# Patient Record
Sex: Female | Born: 1970 | Race: White | Hispanic: No | Marital: Married | Smoking: Never smoker
Health system: Southern US, Academic
[De-identification: ages and names within clinical notes are randomized; demographics above are authoritative.]

## PROBLEM LIST (undated history)

## (undated) DIAGNOSIS — F32A Depression, unspecified: Secondary | ICD-10-CM

## (undated) DIAGNOSIS — I10 Essential (primary) hypertension: Secondary | ICD-10-CM

## (undated) DIAGNOSIS — E079 Disorder of thyroid, unspecified: Secondary | ICD-10-CM

## (undated) HISTORY — PX: HX BREAST AUGMENTATION: SHX7

## (undated) HISTORY — DX: Depression, unspecified: F32.A

## (undated) HISTORY — DX: Disorder of thyroid, unspecified: E07.9

## (undated) HISTORY — DX: Essential (primary) hypertension: I10

## (undated) HISTORY — PX: FACIAL COSMETIC SURGERY: SHX629

---

## 2007-06-25 ENCOUNTER — Other Ambulatory Visit: Payer: Self-pay

## 2007-06-29 LAB — HISTORICAL CYTOPATHOLOGY-GYN (PAP AND HPV TESTS)

## 2008-09-07 ENCOUNTER — Other Ambulatory Visit: Payer: Self-pay

## 2008-09-08 LAB — HISTORICAL CYTOPATHOLOGY-GYN (PAP AND HPV TESTS)

## 2011-02-19 ENCOUNTER — Ambulatory Visit (HOSPITAL_COMMUNITY): Payer: Self-pay | Admitting: Obstetrics & Gynecology

## 2016-09-29 ENCOUNTER — Other Ambulatory Visit (HOSPITAL_COMMUNITY): Payer: Self-pay | Admitting: Obstetrics & Gynecology

## 2016-09-29 DIAGNOSIS — Z1231 Encounter for screening mammogram for malignant neoplasm of breast: Secondary | ICD-10-CM

## 2016-12-30 ENCOUNTER — Ambulatory Visit (HOSPITAL_BASED_OUTPATIENT_CLINIC_OR_DEPARTMENT_OTHER): Payer: Self-pay

## 2016-12-30 ENCOUNTER — Ambulatory Visit
Admission: RE | Admit: 2016-12-30 | Discharge: 2016-12-30 | Disposition: A | Discharge status: Home / Self Care | Payer: BC Managed Care – PPO | Source: Ambulatory Visit | Attending: Obstetrics & Gynecology | Admitting: Obstetrics & Gynecology

## 2016-12-30 DIAGNOSIS — Z1231 Encounter for screening mammogram for malignant neoplasm of breast: Secondary | ICD-10-CM

## 2016-12-30 DIAGNOSIS — Z9882 Breast implant status: Secondary | ICD-10-CM | POA: Insufficient documentation

## 2017-10-29 ENCOUNTER — Other Ambulatory Visit (HOSPITAL_COMMUNITY): Payer: Self-pay | Admitting: Obstetrics & Gynecology

## 2017-10-29 DIAGNOSIS — Z1239 Encounter for other screening for malignant neoplasm of breast: Secondary | ICD-10-CM

## 2018-01-07 ENCOUNTER — Ambulatory Visit
Admission: RE | Admit: 2018-01-07 | Discharge: 2018-01-07 | Disposition: A | Discharge status: Home / Self Care | Payer: BC Managed Care – PPO | Source: Ambulatory Visit | Attending: Obstetrics & Gynecology | Admitting: Obstetrics & Gynecology

## 2018-01-07 ENCOUNTER — Encounter (HOSPITAL_BASED_OUTPATIENT_CLINIC_OR_DEPARTMENT_OTHER): Payer: Self-pay

## 2018-01-07 DIAGNOSIS — Z1231 Encounter for screening mammogram for malignant neoplasm of breast: Secondary | ICD-10-CM | POA: Insufficient documentation

## 2018-01-07 DIAGNOSIS — Z1239 Encounter for other screening for malignant neoplasm of breast: Secondary | ICD-10-CM

## 2018-12-15 ENCOUNTER — Other Ambulatory Visit (HOSPITAL_COMMUNITY): Payer: Self-pay | Admitting: Obstetrics & Gynecology

## 2018-12-15 DIAGNOSIS — Z1239 Encounter for other screening for malignant neoplasm of breast: Secondary | ICD-10-CM

## 2019-01-19 ENCOUNTER — Ambulatory Visit
Admission: RE | Admit: 2019-01-19 | Discharge: 2019-01-19 | Disposition: A | Discharge status: Home / Self Care | Payer: BC Managed Care – PPO | Source: Ambulatory Visit | Attending: Obstetrics & Gynecology | Admitting: Obstetrics & Gynecology

## 2019-01-19 ENCOUNTER — Encounter (HOSPITAL_BASED_OUTPATIENT_CLINIC_OR_DEPARTMENT_OTHER): Payer: Self-pay

## 2019-01-19 ENCOUNTER — Other Ambulatory Visit: Payer: Self-pay

## 2019-01-19 DIAGNOSIS — Z1239 Encounter for other screening for malignant neoplasm of breast: Secondary | ICD-10-CM

## 2019-01-19 DIAGNOSIS — Z1231 Encounter for screening mammogram for malignant neoplasm of breast: Secondary | ICD-10-CM | POA: Insufficient documentation

## 2019-01-29 ENCOUNTER — Other Ambulatory Visit: Payer: Self-pay

## 2019-09-29 ENCOUNTER — Other Ambulatory Visit (HOSPITAL_COMMUNITY): Payer: Self-pay | Admitting: Obstetrics & Gynecology

## 2019-09-29 DIAGNOSIS — Z1239 Encounter for other screening for malignant neoplasm of breast: Secondary | ICD-10-CM

## 2020-01-02 ENCOUNTER — Other Ambulatory Visit: Payer: Self-pay

## 2020-01-26 ENCOUNTER — Other Ambulatory Visit: Payer: Self-pay

## 2020-01-26 ENCOUNTER — Encounter (HOSPITAL_BASED_OUTPATIENT_CLINIC_OR_DEPARTMENT_OTHER): Payer: Self-pay

## 2020-01-26 ENCOUNTER — Ambulatory Visit
Admission: RE | Admit: 2020-01-26 | Discharge: 2020-01-26 | Disposition: A | Discharge status: Home / Self Care | Payer: BC Managed Care – PPO | Source: Ambulatory Visit | Attending: Obstetrics & Gynecology | Admitting: Obstetrics & Gynecology

## 2020-01-26 DIAGNOSIS — Z1239 Encounter for other screening for malignant neoplasm of breast: Secondary | ICD-10-CM

## 2020-09-03 ENCOUNTER — Telehealth (HOSPITAL_BASED_OUTPATIENT_CLINIC_OR_DEPARTMENT_OTHER): Payer: Self-pay | Admitting: Obstetrics & Gynecology

## 2020-09-03 DIAGNOSIS — R232 Flushing: Secondary | ICD-10-CM

## 2020-09-03 NOTE — Telephone Encounter (Signed)
Patient called in stating she was advised to call when she got back from [place] and we will put a order in for labs to check her hormones. Was discussed at 01-26-20 AE with [facility]. Please advise patient.

## 2020-09-04 ENCOUNTER — Other Ambulatory Visit: Payer: Self-pay

## 2020-09-04 ENCOUNTER — Ambulatory Visit: Payer: BC Managed Care – PPO | Attending: Obstetrics & Gynecology

## 2020-09-04 ENCOUNTER — Encounter (HOSPITAL_BASED_OUTPATIENT_CLINIC_OR_DEPARTMENT_OTHER): Payer: Self-pay

## 2020-09-04 DIAGNOSIS — R232 Flushing: Secondary | ICD-10-CM | POA: Insufficient documentation

## 2020-09-04 LAB — FSH: FSH: 0.6 m[IU]/mL

## 2020-09-04 LAB — ESTRADIOL: ESTRADIOL: <24 pg/mL

## 2020-09-04 NOTE — Telephone Encounter (Signed)
Patient called in following up with the message she left yesterday about getting a lab order put in. Please advise patient at earliest convenience.

## 2020-09-06 ENCOUNTER — Other Ambulatory Visit (HOSPITAL_BASED_OUTPATIENT_CLINIC_OR_DEPARTMENT_OTHER): Payer: Self-pay | Admitting: Obstetrics & Gynecology

## 2020-09-06 DIAGNOSIS — Z79890 Hormone replacement therapy (postmenopausal): Secondary | ICD-10-CM

## 2020-09-06 NOTE — Telephone Encounter (Signed)
Patient called in wanting to discuss lab results from where she had it done 2 days ago. Please advise.

## 2020-09-11 ENCOUNTER — Encounter (HOSPITAL_BASED_OUTPATIENT_CLINIC_OR_DEPARTMENT_OTHER): Payer: Self-pay

## 2020-09-11 MED ORDER — ESTRADIOL 2 MG TABLET
2.0000 mg | ORAL_TABLET | Freq: Every day | ORAL | 3 refills | Status: AC
Start: 2020-09-11 — End: 2020-12-10

## 2020-10-03 ENCOUNTER — Other Ambulatory Visit (HOSPITAL_BASED_OUTPATIENT_CLINIC_OR_DEPARTMENT_OTHER): Payer: Self-pay | Admitting: Obstetrics & Gynecology

## 2020-10-03 DIAGNOSIS — Z79890 Hormone replacement therapy (postmenopausal): Secondary | ICD-10-CM

## 2020-10-03 NOTE — Telephone Encounter (Signed)
Patient called in with questions regarding the Estrace she is taking. Was prescribed by [facility] on 09-11-20. Please advise patient.

## 2020-10-03 NOTE — Telephone Encounter (Signed)
LEFT A MESSAGE FOR THE PATIENT TO CALL ME BACK AT [phone]

## 2020-10-04 MED ORDER — MEDROXYPROGESTERONE 2.5 MG TABLET
2.5000 mg | ORAL_TABLET | Freq: Every day | ORAL | 4 refills | Status: DC
Start: 2020-10-04 — End: 2021-02-26

## 2020-10-04 NOTE — Telephone Encounter (Signed)
Patient is on line 1 calling you back. Thank you.

## 2020-10-26 ENCOUNTER — Other Ambulatory Visit (HOSPITAL_BASED_OUTPATIENT_CLINIC_OR_DEPARTMENT_OTHER): Payer: Self-pay | Admitting: Obstetrics & Gynecology

## 2020-10-26 DIAGNOSIS — Z1231 Encounter for screening mammogram for malignant neoplasm of breast: Secondary | ICD-10-CM

## 2021-02-26 ENCOUNTER — Ambulatory Visit
Admission: RE | Admit: 2021-02-26 | Discharge: 2021-02-26 | Disposition: A | Discharge status: Home / Self Care | Payer: Self-pay | Source: Ambulatory Visit | Attending: Obstetrics & Gynecology | Admitting: Obstetrics & Gynecology

## 2021-02-26 ENCOUNTER — Ambulatory Visit (INDEPENDENT_AMBULATORY_CARE_PROVIDER_SITE_OTHER): Payer: BC Managed Care – PPO | Admitting: Obstetrics & Gynecology

## 2021-02-26 ENCOUNTER — Encounter (HOSPITAL_BASED_OUTPATIENT_CLINIC_OR_DEPARTMENT_OTHER): Payer: Self-pay

## 2021-02-26 ENCOUNTER — Other Ambulatory Visit: Payer: Self-pay

## 2021-02-26 ENCOUNTER — Other Ambulatory Visit (HOSPITAL_BASED_OUTPATIENT_CLINIC_OR_DEPARTMENT_OTHER): Payer: Self-pay

## 2021-02-26 ENCOUNTER — Encounter (HOSPITAL_BASED_OUTPATIENT_CLINIC_OR_DEPARTMENT_OTHER): Payer: Self-pay | Admitting: Obstetrics & Gynecology

## 2021-02-26 VITALS — BP 122/82 | Wt 158.8 lb

## 2021-02-26 DIAGNOSIS — Z79890 Hormone replacement therapy (postmenopausal): Secondary | ICD-10-CM | POA: Insufficient documentation

## 2021-02-26 DIAGNOSIS — Z01419 Encounter for gynecological examination (general) (routine) without abnormal findings: Secondary | ICD-10-CM | POA: Insufficient documentation

## 2021-02-26 DIAGNOSIS — Z1212 Encounter for screening for malignant neoplasm of rectum: Secondary | ICD-10-CM

## 2021-02-26 DIAGNOSIS — Z1231 Encounter for screening mammogram for malignant neoplasm of breast: Secondary | ICD-10-CM | POA: Insufficient documentation

## 2021-02-26 MED ORDER — ESTRADIOL 2 MG TABLET
2.0000 mg | ORAL_TABLET | Freq: Every day | ORAL | 3 refills | Status: AC
Start: 2021-02-26 — End: 2021-05-27

## 2021-02-26 MED ORDER — METOPROLOL SUCCINATE ER 25 MG TABLET,EXTENDED RELEASE 24 HR
25.0000 mg | ORAL_TABLET | Freq: Every day | ORAL | 3 refills | Status: AC
Start: 2021-02-26 — End: 2021-05-27

## 2021-02-26 MED ORDER — VENLAFAXINE ER 150 MG CAPSULE,EXTENDED RELEASE 24 HR
150.0000 mg | ORAL_CAPSULE | Freq: Every day | ORAL | 3 refills | Status: DC
Start: 2021-02-26 — End: 2022-04-01

## 2021-02-26 MED ORDER — MEDROXYPROGESTERONE 2.5 MG TABLET
2.5000 mg | ORAL_TABLET | Freq: Every day | ORAL | 4 refills | Status: DC
Start: 2021-02-26 — End: 2022-04-08

## 2021-02-26 NOTE — Progress Notes (Signed)
[facility]  Gynecology Consult    Whitney Giles, 50 y.o. female  Date of Admission:  (Not on file)  Date of service: 02/26/2021  Date of Birth:  1970-09-01    PCP: Whitney Simmer, MD     There are no exam notes on file for this visit.     Information Obtained from: Patient  Chief Complaint   Patient presents with   . Annual Exam     BCM:  Bone Density:  Colonoscopy:2021  Last Mammo: 02/26/2021  Last Pap: 01/26/2020        HPI/subjective:  Whitney Giles is a 50 y.o. G87P2002, White female who presents with an annual exam. Pt notes she is having an increase in "brainfog". She states she has been traveling since 01/31/21 and just got home yesterday. Pt states she stopped at a wellness center yesterday and was told to start meditating to help with her memory. Pt states her bowels have been good. She states she is still physically active and does timed exercises with weights. Overall she is feeling well.  She is on estradiol and Provera.  She feels good on that.  She has no hot flashes.  She continues to exercise regularly.  She travels a lot.    Objective:  ROS:   Constitutional: No fever, chills, or fatigue  Cardiovascular: No chest pain or palpitations  Respiratory: No cough or shortness of breath  Abdomen: No abdominal pain, nausea, or vomiting  Genitourinary: No vaginal discharge or vaginal bleeding  Musculoskeletal: No joint pain or swelling  Neurologic: No headaches, dizziness, or paresthesias    PHYSICAL EXAMINATION:      Weight: 72 kg (158 lb 12.8 oz)     BP (Non-Invasive): 122/82  No LMP recorded (lmp unknown). Patient is perimenopausal.     Constitutional: No acute distress, well-developed, well-nourished  Head: Atraumatic, normocephalic  Cardiovascular: Heart with regular rate and rhythm, no murmurs, rubs, or gallops, normal pulses  Respiratory: Breath sounds are clear, no wheezes, rales, or rhonchi  Abdomen: Abdomen is soft, non-tender without masses, no rebound or guarding  Breast:  Normal, symmetrical, no masses palpated.  Implant x2 noted  Genitourinary: NSSC, no adnexal masses, normal rectal tone uterus high in the vaginal vault no adnexal masses.  Pap from was done.  Rectal exam no masses she is heme-negative.  Extremities: No cyanosis or edema  Neurologic: Grossly intact, alert and oriented x3, normal gait  Psychiatric: Normal affect and behavior    Family History:   Family Medical History:     Problem Relation (Age of Onset)    Breast Cancer Mother    No Known Problems Father, Sister, Brother, Maternal Grandmother, Maternal Grandfather, Paternal Grandmother, Paternal Grandfather, Daughter, Son, Maternal Aunt, Maternal Uncle, Paternal Aunt, Paternal Uncle, Other         OB History   Gravida Para Term Preterm AB Living   2 2 2     2    SAB IAB Ectopic Multiple Live Births           2      # Outcome Date GA Lbr Len/2nd Weight Sex Delivery Anes PTL Lv   2 Term 1995    M CS-Unspec   LIV   1 Term 24    M CS-Unspec   LIV     Past Medical History:   Diagnosis Date   . Disorder of thyroid    . HTN (hypertension)      No Known  Allergies  Dating Summary    No pregnancy episode available        Current Outpatient Medications   Medication Sig   . estradioL (ESTRACE) 2 mg Oral Tablet Take 1 Tablet (2 mg total) by mouth Once a day for 90 days   . levothyroxine (SYNTHROID) 75 mcg Oral Tablet    . medroxyPROGESTERone (PROVERA) 2.5 mg Oral Tablet Take 1 Tablet (2.5 mg total) by mouth Once a day   . metoprolol succinate (TOPROL-XL) 25 mg Oral Tablet Sustained Release 24 hr Take 1 Tablet (25 mg total) by mouth Once a day for 90 days Indications: myocardial reinfarction prevention   . venlafaxine (EFFEXOR XR) 150 mg Oral Capsule, Sust. Release 24 hr Take 1 Capsule (150 mg total) by mouth Once a day for 90 days     Impression/Recommendations:  1. Encounter for well woman exam with routine gynecological exam    2. Hormone replacement therapy      Orders Placed This Encounter   . MAMMO BILATERAL SCREENING-ADDL  VIEWS/BREAST US AS REQ BY RAD   . medroxyPROGESTERone (PROVERA) 2.5 mg Oral Tablet   . estradioL (ESTRACE) 2 mg Oral Tablet   . metoprolol succinate (TOPROL-XL) 25 mg Oral Tablet Sustained Release 24 hr   . venlafaxine (EFFEXOR XR) 150 mg Oral Capsule, Sust. Release 24 hr   . CYTOPATHOLOGY, GYN +/- HIGH RISK HPV       I am scribing for, and in the presence of, Dr. Valla Leaver for services provided on 02/26/2021.  Burnis Kingfisher, SCRIBE       I personally performed the services described in this documentation, as scribed  in my presence, and it is both accurate  and complete.    Radene Ou, MD

## 2021-02-26 NOTE — Addendum Note (Signed)
Addended by: Delila Spence A on: 02/26/2021 02:42 PM     Modules accepted: Orders

## 2021-02-26 NOTE — Nursing Note (Signed)
02/26/21 1400   Fecal Immunochemical Test (FIT)   Fit Result Negative   Lot # [id number]   Expiration Date 08/17/22   Internal control valid Yes   Initials KAF

## 2021-03-07 LAB — THIN PREP PAP (QUEST)

## 2021-03-12 ENCOUNTER — Other Ambulatory Visit: Payer: Self-pay

## 2021-03-23 LAB — CYTOPATHOLOGY, GYN +/- HIGH RISK HPV

## 2021-03-24 ENCOUNTER — Other Ambulatory Visit (HOSPITAL_COMMUNITY): Payer: Self-pay | Admitting: Obstetrics & Gynecology

## 2021-03-24 MED ORDER — FLUCONAZOLE 150 MG TABLET
150.0000 mg | ORAL_TABLET | Freq: Once | ORAL | 0 refills | Status: AC
Start: 2021-03-24 — End: 2021-03-24

## 2022-04-01 ENCOUNTER — Other Ambulatory Visit (HOSPITAL_BASED_OUTPATIENT_CLINIC_OR_DEPARTMENT_OTHER): Payer: Self-pay | Admitting: NURSE PRACTITIONER

## 2022-04-01 DIAGNOSIS — F32A Depression, unspecified: Secondary | ICD-10-CM

## 2022-04-01 MED ORDER — VENLAFAXINE ER 150 MG CAPSULE,EXTENDED RELEASE 24 HR
150.0000 mg | ORAL_CAPSULE | Freq: Every day | ORAL | 0 refills | Status: DC
Start: 2022-04-01 — End: 2022-04-08

## 2022-04-01 NOTE — Telephone Encounter (Signed)
Attempted to contact patient without success, left message to return call to the office.   Almus Woodham, LPN

## 2022-04-01 NOTE — Telephone Encounter (Signed)
Patient returned call to the office. She does need her Effexor refilled to make it to her appt on 04/08/22. Order in pending. She says that she only has 4 pills left at this time.   Lawernce Ion, LPN

## 2022-04-01 NOTE — Telephone Encounter (Signed)
Received refill request:  Venlafaxine HCL ER 150 MG  Qty 90  Take one capsule by mouth once daily    Annual is 04/08/22

## 2022-04-07 NOTE — Progress Notes (Unsigned)
[facility]  OB/GYN, MEDICAL OFFICE BUILDING B  [address]  [facility] [place] [address]  [phone]         ANNUAL VISIT    PATIENT: Whitney Giles  CHART NUMBER: [id number]  DATE OF SERVICE: 04/08/2022    Chief Complaint   Patient presents with    Annual Exam     Patient denies any issues at this time.   Birth control: menopausal   Mammogram:04/08/22  Colonoscopy:2021           HPI: Jerzy Crotteau is a 51 y.o. year old who presents to the office today for her annual exam. No LMP recorded (lmp unknown). Patient is postmenopausal.  Denies any vaginal bleeding.  Follows with specialist in [place] for HRT.  Doing well on HRT.    Extremely dense breast tissue on mammogram.  Mother had breast cancer.  Recommended bilateral breast ultrasound.      PAST MEDICAL HISTORY     Past Medical History:   Diagnosis Date    Depression     Disorder of thyroid     HTN (hypertension)        PAST SURGICAL HISTORY     Past Surgical History:   Procedure Laterality Date    FACIAL COSMETIC SURGERY      HX BREAST AUGMENTATION      HX CESAREAN SECTION         OB HISTORY     OB History   Gravida Para Term Preterm AB Living   2 2 2     2    SAB IAB Ectopic Multiple Live Births           2      # Outcome Date GA Lbr Len/2nd Weight Sex Delivery Anes PTL Lv   2 Term 57    M CS-Unspec   LIV   1 Term 19    M CS-Unspec   LIV       SOCIAL HISTORY     Social History     Socioeconomic History    Marital status: Married   Tobacco Use    Smoking status: Never    Smokeless tobacco: Never   Substance and Sexual Activity    Alcohol use: Yes    Drug use: Not Currently     Types: Marijuana    Sexual activity: Yes     Birth control/protection: Other        FAMILY HISTORY     Family Medical History:       Problem Relation (Age of Onset)    Breast Cancer Mother    No Known Problems Father, Sister, Brother, Maternal Grandmother, Maternal Grandfather, Paternal Grandmother, Paternal Grandfather, Daughter, Son, Maternal Aunt,  Maternal Uncle, Paternal Aunt, Paternal Uncle, Other            CURRENT MEDICATIONS      Current Outpatient Medications   Medication Sig    estradioL (ESTRACE) 2 mg Oral Tablet Take 1 Tablet (2 mg total) by mouth Once a day    levothyroxine (SYNTHROID) 75 mcg Oral Tablet     metoprolol succinate (TOPROL-XL) 25 mg Oral Tablet Sustained Release 24 hr Take 1 Tablet (25 mg total) by mouth Once a day for 90 days Indications: myocardial reinfarction prevention    progesterone micronized (PROMETRIUM) 100 mg Oral Capsule Take 3 Capsules (300 mg total) by mouth    venlafaxine (EFFEXOR XR) 150 mg Oral Capsule, Sust. Release 24 hr Take 1 Capsule (150 mg  total) by mouth Once a day       ALLERGIES   Patient has no known allergies.       REVIEW OF SYSTEMS    Review of Systems   Constitutional:  Negative for fatigue and fever.   HENT:  Negative for congestion and sore throat.    Respiratory:  Negative for cough and shortness of breath.    Cardiovascular:  Negative for chest pain, palpitations and leg swelling.   Gastrointestinal:  Negative for abdominal pain, blood in stool, constipation, diarrhea, nausea and vomiting.   Genitourinary:  Negative for difficulty urinating, dyspareunia, dysuria, pelvic pain, vaginal bleeding, vaginal discharge and vaginal pain.   Musculoskeletal:  Negative for back pain and myalgias.   Skin: Negative.  Negative for rash.   Neurological:  Negative for weakness and headaches.   Psychiatric/Behavioral:  Negative for suicidal ideas. The patient is not nervous/anxious.           PHYSICAL EXAMINATION     Vitals:    04/08/22 1419   BP: 126/76   Pulse: 92   SpO2: 99%   Weight: 70.9 kg (156 lb 3.2 oz)   Height: 1.676 m (5\' 6" )   BMI: 25.26     Physical Exam  Constitutional:       Appearance: Normal appearance.   Genitourinary:      Vulva, bladder, rectum and urethral meatus normal.      Right Labia: No rash, tenderness, lesions, skin changes or Bartholin's cyst.     Left Labia: No tenderness, lesions, skin  changes, Bartholin's cyst or rash.     Vaginal cuff intact.     No vaginal erythema or tenderness.      No vaginal prolapse present.     No vaginal atrophy present.       Right Adnexa: not tender and no mass present.     Left Adnexa: not tender and no mass present.     No cervical motion tenderness or friability.      Uterus is not enlarged or tender.      No uterine mass detected.     Bladder is not tender.       Pelvic exam was performed with patient in the lithotomy position.   Breasts:     Right: Breast implant present. No swelling, bleeding, inverted nipple, mass, nipple discharge, skin change or tenderness.      Left: Breast implant present. No swelling, bleeding, inverted nipple, mass, nipple discharge, skin change or tenderness.   HENT:      Head: Normocephalic and atraumatic.   Eyes:      Extraocular Movements: Extraocular movements intact.      Conjunctiva/sclera: Conjunctivae normal.   Neck:      Thyroid: No thyroid mass, thyromegaly or thyroid tenderness.   Cardiovascular:      Rate and Rhythm: Normal rate and regular rhythm.      Pulses: Normal pulses.      Heart sounds: Normal heart sounds, S1 normal and S2 normal.   Pulmonary:      Effort: Pulmonary effort is normal.      Breath sounds: Normal breath sounds.   Abdominal:      General: Abdomen is flat. Bowel sounds are normal.      Palpations: Abdomen is soft.      Tenderness: There is no abdominal tenderness.   Musculoskeletal:         General: Normal range of motion.      Cervical back: Normal  range of motion and neck supple.   Lymphadenopathy:      Upper Body:      Right upper body: No axillary adenopathy.      Left upper body: No axillary adenopathy.   Neurological:      General: No focal deficit present.      Mental Status: She is alert and oriented to person, place, and time. Mental status is at baseline.   Skin:     General: Skin is warm and dry.      Capillary Refill: Capillary refill takes less than 2 seconds.   Psychiatric:         Mood and  Affect: Mood normal.         Behavior: Behavior normal.   Vitals reviewed.            DEPRESSION SCREENING   Nursing Notes:   Thornton Dales, LPN  32/67/12 4580  Signed     04/08/22 1425   Depression Screen   Little interest or pleasure in doing things. 0   Feeling down, depressed, or hopeless 0   PHQ 2 Total 0         ASSESSMENT/PLAN       ICD-10-CM    1. Encounter for well woman exam with routine gynecological exam  Z01.419       2. Screening for cervical cancer  Z12.4 CYTOPATHOLOGY, GYN +/- HIGH RISK HPV      3. Encounter for screening mammogram for malignant neoplasm of breast  Z12.31 MAMMO BILATERAL SCREENING-ADDL VIEWS/BREAST US AS REQ BY RAD      4. Encounter for Hemoccult screening  Z12.11 POCT FECAL IMMUNOCHEMICAL TEST (FIT) -  negative      5. Depression, unspecified depression type  F32.A venlafaxine (EFFEXOR XR) 150 mg Oral Capsule, Sust. Release 24 hr      6. Extremely dense tissue of both breasts on mammography  R92.343 US BREAST LEFT-ADDL VIEWS/BREAST US AS REQ BY RAD     US BREAST RIGHT-ADDL VIEWS/BREAST US AS REQ BY RAD      7. Family history of breast cancer  Z80.3 US BREAST LEFT-ADDL VIEWS/BREAST US AS REQ BY RAD     US BREAST RIGHT-ADDL VIEWS/BREAST US AS REQ BY RAD            EDUCATION     Contact the office immediately if you have ANY vaginal bleeding.      FOLLOW-UP   Return in about 1 year (around 04/09/2023) for Annual or as needed.         Otelia Sergeant, APRN,FNP-BC

## 2022-04-08 ENCOUNTER — Encounter (HOSPITAL_BASED_OUTPATIENT_CLINIC_OR_DEPARTMENT_OTHER): Payer: Self-pay | Admitting: NURSE PRACTITIONER

## 2022-04-08 ENCOUNTER — Inpatient Hospital Stay
Admission: RE | Admit: 2022-04-08 | Discharge: 2022-04-08 | Disposition: A | Discharge status: Home / Self Care | Payer: 59 | Source: Ambulatory Visit | Attending: Obstetrics & Gynecology | Admitting: Obstetrics & Gynecology

## 2022-04-08 ENCOUNTER — Ambulatory Visit (INDEPENDENT_AMBULATORY_CARE_PROVIDER_SITE_OTHER): Payer: 59 | Admitting: NURSE PRACTITIONER

## 2022-04-08 ENCOUNTER — Other Ambulatory Visit (HOSPITAL_BASED_OUTPATIENT_CLINIC_OR_DEPARTMENT_OTHER): Payer: 59

## 2022-04-08 ENCOUNTER — Encounter (HOSPITAL_BASED_OUTPATIENT_CLINIC_OR_DEPARTMENT_OTHER): Payer: Self-pay

## 2022-04-08 ENCOUNTER — Other Ambulatory Visit: Payer: Self-pay

## 2022-04-08 ENCOUNTER — Ambulatory Visit: Payer: 59 | Attending: NURSE PRACTITIONER | Admitting: NURSE PRACTITIONER

## 2022-04-08 VITALS — BP 126/76 | HR 92 | Ht 66.0 in | Wt 156.2 lb

## 2022-04-08 DIAGNOSIS — Z01419 Encounter for gynecological examination (general) (routine) without abnormal findings: Secondary | ICD-10-CM | POA: Insufficient documentation

## 2022-04-08 DIAGNOSIS — Z1211 Encounter for screening for malignant neoplasm of colon: Secondary | ICD-10-CM

## 2022-04-08 DIAGNOSIS — Z79890 Hormone replacement therapy (postmenopausal): Secondary | ICD-10-CM | POA: Insufficient documentation

## 2022-04-08 DIAGNOSIS — Z1231 Encounter for screening mammogram for malignant neoplasm of breast: Secondary | ICD-10-CM

## 2022-04-08 DIAGNOSIS — Z124 Encounter for screening for malignant neoplasm of cervix: Secondary | ICD-10-CM

## 2022-04-08 DIAGNOSIS — F32A Depression, unspecified: Secondary | ICD-10-CM

## 2022-04-08 DIAGNOSIS — Z803 Family history of malignant neoplasm of breast: Secondary | ICD-10-CM

## 2022-04-08 DIAGNOSIS — R92343 Mammographic extreme density, bilateral breasts: Secondary | ICD-10-CM

## 2022-04-08 LAB — POCT FECAL IMMUNOCHEMICAL TEST (FIT): FIT FECAL TEST: NEGATIVE

## 2022-04-08 MED ORDER — VENLAFAXINE ER 150 MG CAPSULE,EXTENDED RELEASE 24 HR
150.0000 mg | ORAL_CAPSULE | Freq: Every day | ORAL | 3 refills | Status: AC
Start: 2022-04-08 — End: ?

## 2022-04-08 NOTE — Nursing Note (Signed)
04/08/22 1425   Depression Screen   Little interest or pleasure in doing things. 0   Feeling down, depressed, or hopeless 0   PHQ 2 Total 0

## 2022-04-23 LAB — THIN PREP PAP (QUEST)

## 2022-04-24 NOTE — Result Encounter Note (Signed)
Your pap is negative and normal. Follow up as scheduled.    Sundae Maners FNP-BC

## 2022-04-30 ENCOUNTER — Other Ambulatory Visit: Payer: Self-pay

## 2022-04-30 ENCOUNTER — Inpatient Hospital Stay (HOSPITAL_BASED_OUTPATIENT_CLINIC_OR_DEPARTMENT_OTHER)
Admission: RE | Admit: 2022-04-30 | Discharge: 2022-04-30 | Disposition: A | Discharge status: Home / Self Care | Payer: 59 | Source: Ambulatory Visit | Attending: Family | Admitting: Family

## 2022-04-30 ENCOUNTER — Inpatient Hospital Stay
Admission: RE | Admit: 2022-04-30 | Discharge: 2022-04-30 | Disposition: A | Discharge status: Home / Self Care | Payer: 59 | Source: Ambulatory Visit | Attending: NURSE PRACTITIONER | Admitting: NURSE PRACTITIONER

## 2022-04-30 DIAGNOSIS — R92343 Mammographic extreme density, bilateral breasts: Secondary | ICD-10-CM

## 2022-04-30 DIAGNOSIS — Z803 Family history of malignant neoplasm of breast: Secondary | ICD-10-CM

## 2022-05-02 ENCOUNTER — Telehealth (HOSPITAL_BASED_OUTPATIENT_CLINIC_OR_DEPARTMENT_OTHER): Payer: Self-pay | Admitting: NURSE PRACTITIONER

## 2022-05-02 NOTE — Telephone Encounter (Signed)
Discussed pap and [place] results with patient. She stated that she was unable to get on to MyChart to see her messages.   Lawernce Ion, LPN

## 2022-05-02 NOTE — Telephone Encounter (Signed)
Patient is calling in regards to her wanting her results. her number is [phone]

## 2022-05-11 LAB — CYTOPATHOLOGY, GYN +/- HIGH RISK HPV

## 2022-08-25 ENCOUNTER — Telehealth (HOSPITAL_BASED_OUTPATIENT_CLINIC_OR_DEPARTMENT_OTHER): Payer: Self-pay | Admitting: Obstetrics & Gynecology

## 2022-08-25 NOTE — Telephone Encounter (Signed)
[facility]   Request refill authorization    Estradiol 2mg  tab  Qty 90   Take one tablet by mouth once daily     Last filled 02/21/22    No future appt  Annual 04/08/22 completed  Annual 02/26/21 completed

## 2022-08-25 NOTE — Telephone Encounter (Signed)
Last filled: 02/26/2021  Last OV note says she was following with [place] for HRT.  Jonn Shingles, RN

## 2023-04-28 ENCOUNTER — Other Ambulatory Visit: Payer: Self-pay

## 2023-04-28 ENCOUNTER — Ambulatory Visit (HOSPITAL_BASED_OUTPATIENT_CLINIC_OR_DEPARTMENT_OTHER): Payer: 59

## 2023-04-28 ENCOUNTER — Ambulatory Visit (HOSPITAL_BASED_OUTPATIENT_CLINIC_OR_DEPARTMENT_OTHER): Payer: 59 | Admitting: OBSTETRICS/GYNECOLOGY

## 2023-04-28 ENCOUNTER — Inpatient Hospital Stay
Admission: RE | Admit: 2023-04-28 | Discharge: 2023-04-28 | Disposition: A | Discharge status: Home / Self Care | Payer: 59 | Source: Ambulatory Visit | Attending: NURSE PRACTITIONER | Admitting: NURSE PRACTITIONER

## 2023-04-28 DIAGNOSIS — Z029 Encounter for administrative examinations, unspecified: Secondary | ICD-10-CM

## 2023-04-28 DIAGNOSIS — Z1231 Encounter for screening mammogram for malignant neoplasm of breast: Secondary | ICD-10-CM | POA: Insufficient documentation

## 2023-04-30 ENCOUNTER — Telehealth (HOSPITAL_BASED_OUTPATIENT_CLINIC_OR_DEPARTMENT_OTHER): Payer: Self-pay | Admitting: NURSE PRACTITIONER

## 2023-04-30 DIAGNOSIS — R92343 Mammographic extreme density, bilateral breasts: Secondary | ICD-10-CM

## 2023-05-04 NOTE — Telephone Encounter (Signed)
Orders entered for bilateral breast ultrasound.    Whitney Broughton Elwyn Lade, APRN,FNP-BC

## 2023-05-05 ENCOUNTER — Other Ambulatory Visit: Payer: Self-pay

## 2023-05-05 ENCOUNTER — Encounter (HOSPITAL_BASED_OUTPATIENT_CLINIC_OR_DEPARTMENT_OTHER): Payer: Self-pay | Admitting: OBSTETRICS/GYNECOLOGY

## 2023-05-05 ENCOUNTER — Ambulatory Visit (HOSPITAL_BASED_OUTPATIENT_CLINIC_OR_DEPARTMENT_OTHER): Payer: 59 | Admitting: OBSTETRICS/GYNECOLOGY

## 2023-05-05 ENCOUNTER — Other Ambulatory Visit: Payer: 59 | Attending: OBSTETRICS/GYNECOLOGY

## 2023-05-05 VITALS — BP 104/72 | Ht 65.5 in | Wt 152.0 lb

## 2023-05-05 DIAGNOSIS — R9230 Dense breasts, unspecified: Secondary | ICD-10-CM

## 2023-05-05 DIAGNOSIS — Z1231 Encounter for screening mammogram for malignant neoplasm of breast: Secondary | ICD-10-CM | POA: Insufficient documentation

## 2023-05-05 DIAGNOSIS — Z01419 Encounter for gynecological examination (general) (routine) without abnormal findings: Secondary | ICD-10-CM

## 2023-05-05 NOTE — Nursing Note (Signed)
05/05/23 1514   Depression Screen   Little interest or pleasure in doing things. 0   Feeling down, depressed, or hopeless 0   PHQ 2 Total 0

## 2023-05-05 NOTE — Progress Notes (Signed)
OB/GYN, MEDICAL OFFICE [facility]ILDING [facility]  [address]  [facility] [place] [address]    Progress Note    Name: Whitney Giles MRN:  [id number]   Date: 05/05/2023 DOB:  1970/07/22 (52 y.o.)             Encounter Date: 05/05/2023    Patient ID:  Whitney Giles  EPP:[id number]    DOB: November 05, 1970  Age: 52 y.o. female    Subjective:     Chief Complaint   Patient presents with    Annual Exam     Last pap: 04/08/22- normal   Last mammogram: 04/28/23- normal dense breast tissue   Last colonoscopy: 2021- normal   Last bone density: none   LMP: post menopausal     Patient here for annual exam. No issues or concerns. She would like to have u/s ordered for dense breast tissue but she lives in [place] in the winter so needs orders sent to [facility].        HPI:  52 yo G2P2002 here today for annual exam. No problems.  She has been seeing a wellness doctor in [place] who prescribes her HRT for menopause.  She wants an [place] for breast density and plans to get that in [place]. Lives in [place] half the year.       Past Medical History  Past Medical History:   Diagnosis Date    Depression     Disorder of thyroid     HTN (hypertension)          Past Surgical History:   Procedure Laterality Date    FACIAL COSMETIC SURGERY      HX BREAST AUGMENTATION      HX CESAREAN SECTION      x2      Social History     Tobacco Use    Smoking status: Never    Smokeless tobacco: Never   Vaping Use    Vaping status: Never Used   Substance Use Topics    Alcohol use: Yes     Comment: social    Drug use: Not Currently     Types: Marijuana     Comment: gummie      Family Medical History:       Problem Relation (Age of Onset)    Breast Cancer Mother    No Known Problems Father, Sister, Brother, Maternal Grandmother, Maternal Grandfather, Paternal Grandmother, Paternal Grandfather, Daughter, Son, Maternal Aunt, Maternal Uncle, Paternal Aunt, Paternal Uncle, Other             OB History   Gravida Para Term Preterm AB Living   2 2 2     2    SAB IAB Ectopic  Multiple Live Births           2      # Outcome Date GA Lbr Len/2nd Weight Sex Type Anes PTL Lv   2 Term 55    M CS-Unspec   LIV   1 Term 66    M CS-Unspec   LIV     Result Date Procedure Results Follow-ups Next Due   04/08/2022 CYTOPATHOLOGY, GYN +/- HIGH RISK HPV HPV Reflex Request: Other (Specify)  HPV Ordered: No     02/26/2021 CYTOPATHOLOGY, GYN +/- HIGH RISK HPV HPV Reflex Request: Other (Specify)  HPV Ordered: No     09/07/2008 CYTOPATHOLOGY, GYN +/- HIGH RISK HPV      06/25/2007 CYTOPATHOLOGY, GYN +/- HIGH RISK HPV  Current Outpatient Medications   Medication Sig    estradioL (ESTRACE) 2 mg Oral Tablet Take 1 Tablet (2 mg total) by mouth Once a day    levothyroxine (SYNTHROID) 75 mcg Oral Tablet     metoprolol succinate (TOPROL-XL) 25 mg Oral Tablet Sustained Release 24 hr Take 1 Tablet (25 mg total) by mouth Once a day for 90 days Indications: myocardial reinfarction prevention    progesterone micronized (PROMETRIUM) 100 mg Oral Capsule Take 2 Capsules (200 mg total) by mouth Every night    venlafaxine (EFFEXOR XR) 150 mg Oral Capsule, Sust. Release 24 hr Take 1 Capsule (150 mg total) by mouth Once a day     No Known Allergies     Objective:   Vitals:   Vitals:    05/05/23 1510   BP: 104/72   Weight: 68.9 kg (152 lb)   Height: 1.664 m (5' 5.5")   BMI: 24.91      Nursing Notes:   Verner Mould, [facility]  05/05/23 1514  Signed     05/05/23 1514   Depression Screen   Little interest or pleasure in doing things. 0   Feeling down, depressed, or hopeless 0   PHQ 2 Total 0       Physical Exam:   Head: NCAT  Neck: supple  Heart: regular rate and rhythm, no murmurs, rubs or gallops  Lungs: CTAB  Breasts: appear symmetric bilaterally without skin dimpling, nipple retraction, edema or erythema. No palpable masses or lymphadenopathy in axilla or supraclavicular, implants in place  Abdomen: soft, non-tender, nondistended, normoactive BS. No palpable masses.   Pelvic exam: normal appearing external genitalia,  vaginal mucosa appears normal, cervix and uterus are surgically absent. no adnexal masses.   Chaperone: Kinnie Feil, MA      Assessment & Plan:     Problem List Items Addressed This Visit    None  Visit Diagnoses       Dense breast tissue on mammogram, unspecified type    -  Primary    Relevant Orders    US BREAST LEFT-ADDL VIEWS/BREAST US AS REQ BY RAD    US BREAST RIGHT-ADDL VIEWS/BREAST US AS REQ BY RAD              No follow-ups on file.    I have reviewed this note in its entirety and agree with clinical staff on their assessments. Patient consented and agreed to the course of treatment. I have counseled patient and answered all questions.  The patient was informed to contact the office within 7 business days if a message/lab results/referral/imaging results have not been conveyed to the patient.    This note may have been partially generated using MModal Fluency Direct system, and there may be some incorrect words, spellings, and punctuation that were not noted in checking the note before saving, though effort was made to avoid such errors.    Dondra Prader, MD

## 2023-05-09 NOTE — Progress Notes (Signed)
OB/GYN, MEDICAL OFFICE BUILDING B  [address]  [facility] [place] [address]    Progress Note    Name: Whitney Giles MRN:  [id number]   Date: 04/28/2023 DOB:  1970-06-13 (52 y.o.)             Encounter Date: 04/28/2023    Patient ID:  Whitney Giles  ONG:[id number]    DOB: 01/14/1971  Age: 52 y.o. female    Subjective:     Chief Complaint   Patient presents with    Annual Exam     Last pap 04/08/2022. Last mammogram 04/08/2022.        HPI:  Patient rescheduled her visit but nurse had precharted prior to her arrival and started visit     Past Medical History  Past Medical History:   Diagnosis Date    Depression     Disorder of thyroid     HTN (hypertension)          Past Surgical History:   Procedure Laterality Date    FACIAL COSMETIC SURGERY      HX BREAST AUGMENTATION      HX CESAREAN SECTION      x2      Social History     Tobacco Use    Smoking status: Never    Smokeless tobacco: Never   Vaping Use    Vaping status: Never Used   Substance Use Topics    Alcohol use: Yes     Comment: social    Drug use: Not Currently     Types: Marijuana     Comment: gummie      Family Medical History:       Problem Relation (Age of Onset)    Breast Cancer Mother    No Known Problems Father, Sister, Brother, Maternal Grandmother, Maternal Grandfather, Paternal Grandmother, Paternal Grandfather, Daughter, Son, Maternal Aunt, Maternal Uncle, Paternal Aunt, Paternal Uncle, Other             OB History   Gravida Para Term Preterm AB Living   2 2 2     2    SAB IAB Ectopic Multiple Live Births           2      # Outcome Date GA Lbr Len/2nd Weight Sex Type Anes PTL Lv   2 Term 1995    M CS-Unspec   LIV   1 Term 57    M CS-Unspec   LIV     Result Date Procedure Results Follow-ups Next Due   05/05/2023 CYTOPATHOLOGY, GYN +/- HIGH RISK HPV      04/08/2022 CYTOPATHOLOGY, GYN +/- HIGH RISK HPV HPV Reflex Request: Other (Specify)  HPV Ordered: No     02/26/2021 CYTOPATHOLOGY, GYN +/- HIGH RISK HPV HPV Reflex Request: Other  (Specify)  HPV Ordered: No     09/07/2008 CYTOPATHOLOGY, GYN +/- HIGH RISK HPV      06/25/2007 CYTOPATHOLOGY, GYN +/- HIGH RISK HPV          Current Outpatient Medications   Medication Sig    estradioL (ESTRACE) 2 mg Oral Tablet Take 1 Tablet (2 mg total) by mouth Once a day    levothyroxine (SYNTHROID) 75 mcg Oral Tablet     metoprolol succinate (TOPROL-XL) 25 mg Oral Tablet Sustained Release 24 hr Take 1 Tablet (25 mg total) by mouth Once a day for 90 days Indications: myocardial reinfarction prevention    progesterone micronized (PROMETRIUM) 100 mg Oral Capsule Take 2 Capsules (200  mg total) by mouth Every night    venlafaxine (EFFEXOR XR) 150 mg Oral Capsule, Sust. Release 24 hr Take 1 Capsule (150 mg total) by mouth Once a day     No Known Allergies     Objective:   Vitals:   There were no vitals filed for this visit.   There are no exam notes on file for this visit.  OBGyn Exam  Assessment & Plan:     Problem List Items Addressed This Visit    None  Visit Diagnoses       Encounter for screening for malignant neoplasm of breast, unspecified screening modality    -  Primary              No follow-ups on file.    I have reviewed this note in its entirety and agree with clinical staff on their assessments. Patient consented and agreed to the course of treatment. I have counseled patient and answered all questions.  The patient was informed to contact the office within 7 business days if a message/lab results/referral/imaging results have not been conveyed to the patient.    This note may have been partially generated using MModal Fluency Direct system, and there may be some incorrect words, spellings, and punctuation that were not noted in checking the note before saving, though effort was made to avoid such errors.    Dondra Prader, MD

## 2023-05-11 ENCOUNTER — Ambulatory Visit (HOSPITAL_BASED_OUTPATIENT_CLINIC_OR_DEPARTMENT_OTHER): Payer: 59

## 2023-05-11 ENCOUNTER — Other Ambulatory Visit (HOSPITAL_BASED_OUTPATIENT_CLINIC_OR_DEPARTMENT_OTHER): Payer: Self-pay | Admitting: OBSTETRICS/GYNECOLOGY

## 2023-05-11 LAB — CYTOPATHOLOGY, GYN +/- HIGH RISK HPV

## 2023-05-18 ENCOUNTER — Other Ambulatory Visit (HOSPITAL_BASED_OUTPATIENT_CLINIC_OR_DEPARTMENT_OTHER): Payer: 59 | Admitting: OBSTETRICS/GYNECOLOGY

## 2023-06-24 ENCOUNTER — Other Ambulatory Visit (HOSPITAL_BASED_OUTPATIENT_CLINIC_OR_DEPARTMENT_OTHER): Payer: Self-pay | Admitting: OBSTETRICS/GYNECOLOGY

## 2023-06-24 ENCOUNTER — Telehealth (HOSPITAL_BASED_OUTPATIENT_CLINIC_OR_DEPARTMENT_OTHER): Payer: Self-pay | Admitting: OBSTETRICS/GYNECOLOGY

## 2023-06-24 DIAGNOSIS — R9230 Dense breasts, unspecified: Secondary | ICD-10-CM

## 2023-06-24 NOTE — Telephone Encounter (Signed)
Radiology in [facility] needs new external orders for breast u/s.Marland Kitchen Pt was here in December for annual. Mammo showed dense breasts and recommended u/s. Patient lives in [place] during winter months. Can you please sign? Thank you!    Verner Mould, CMA

## 2023-08-17 IMAGING — MR MRI ABDOMEN W/WO CONTRAST
15 of 20 series · 29 of 48 positions shown · IV contrast (gadavist)
Comparison: MRI from June 2023

________________________________________________________________________________________________ 
MRI ABDOMEN W/WO CONTRAST, 08/17/2023 [DATE]: 
CLINICAL INDICATION: Hepatomegaly, not elsewhere classified
TECHNIQUE: Multiplanar, multiecho position MR images of the abdomen were 
performed without and with intravenous enhancement. 6.5 mL of Gadavist were 
injected intravenously by hand. 1 mL of Gadavist discarded.

[Series 101: survey-head 1st · axial · 15.0mm · 1.76mm/px · 1 of 15 slices shown]
[im 1/15]
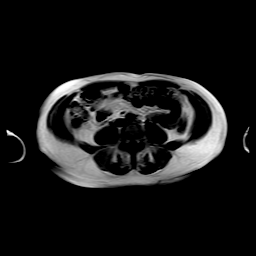

[Series 201: T2 · coronal · 5.0mm · 0.67mm/px · 1 of 34 slices shown]
[im 1/34]
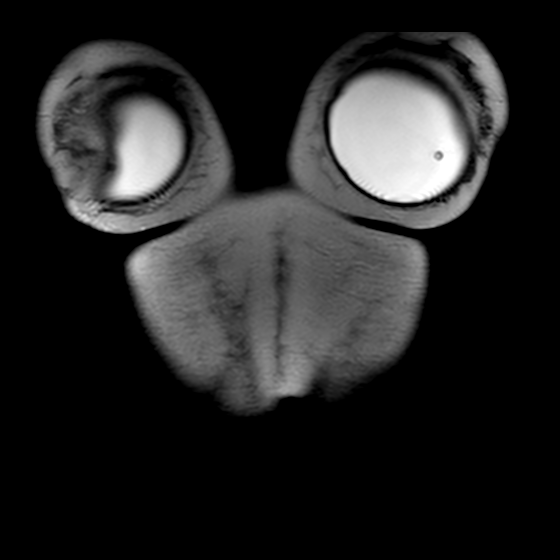

[Series 302: sout of phase · axial · 6.0mm · 1.13mm/px · 1 of 36 slices shown]
[im 1/36]
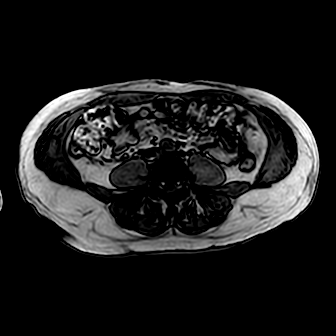

[Series 303: sin phase · axial · 6.0mm · 1.13mm/px · 1 of 36 slices shown]
[im 1/36]
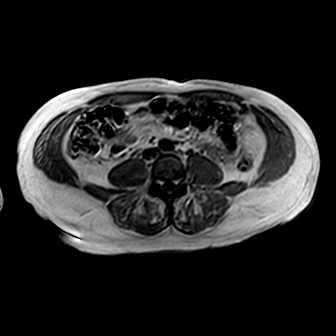

[Series 401: t2_ax_mvxd_hr_rt · axial · 5.0mm · 0.74mm/px · 1 of 41 slices shown]
[im 1/41]
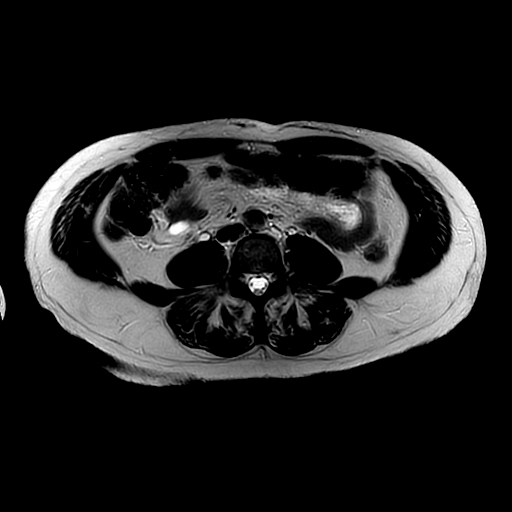

[Series 501: t2_spair mvxd_rt_fast · axial · 5.0mm · 0.85mm/px · 1 of 41 slices shown]
[im 1/41]
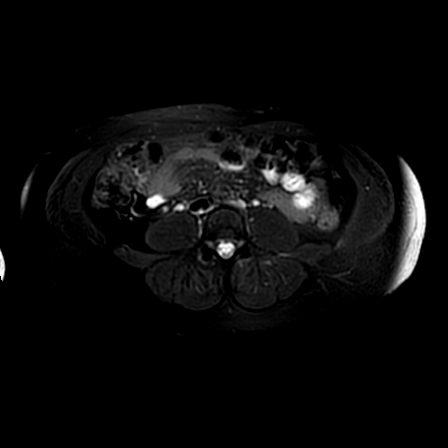

[Series 602: sbo · axial · 5.0mm · 1.70mm/px · 1 of 45 slices shown]
[im 1/45]
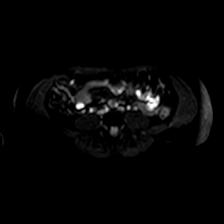

[Series 603: (id) · axial · 5.0mm · 1.70mm/px · 1 of 45 slices shown]
[im 1/45]
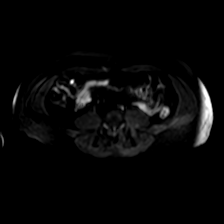

[Series 604: dadc 600 · axial · 5.0mm · 1.70mm/px · 1 of 45 slices shown]
[im 1/45]
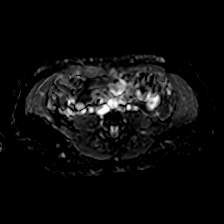

[Series 702: DIXON · axial · 4.0mm · 0.86mm/px · z∈[-47,+197]mm · 3 of 123 slices shown (1 of 5)]
[im 1/123]
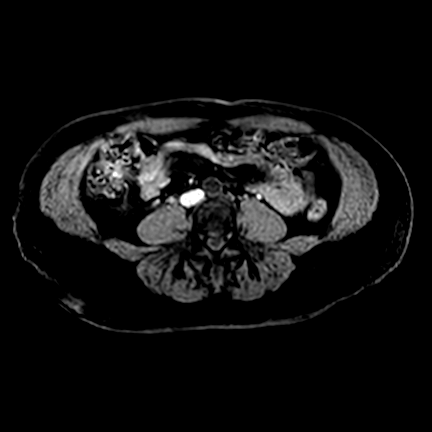
[im 62/123]
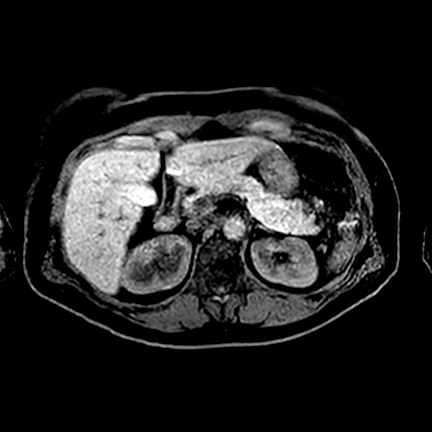
[im 123/123]
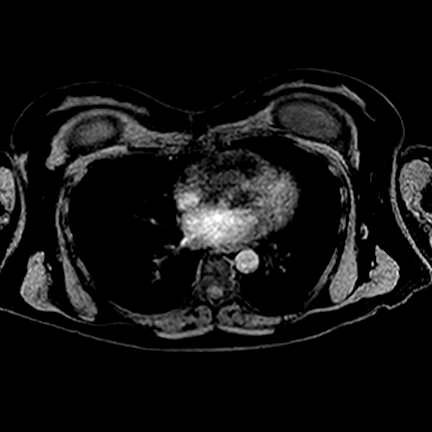

[Series 703: DIXON · axial · 4.0mm · 0.86mm/px · z∈[-47,+197]mm · 3 of 123 slices shown (2 of 5)]
[im 1/123]
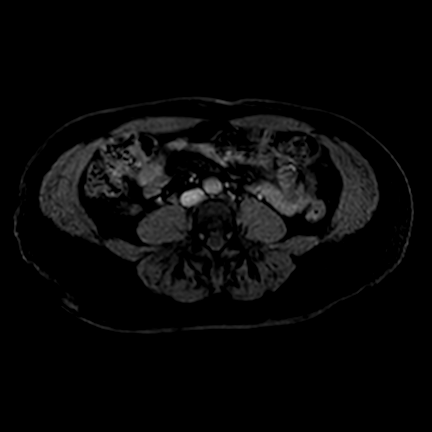
[im 62/123]
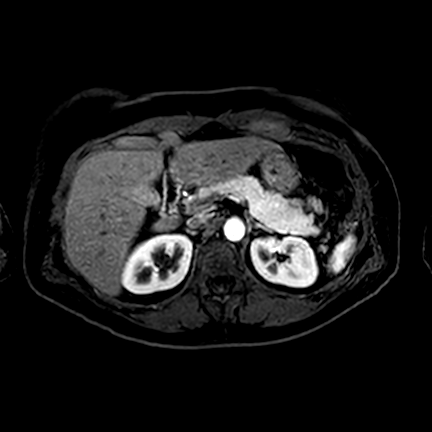
[im 123/123]
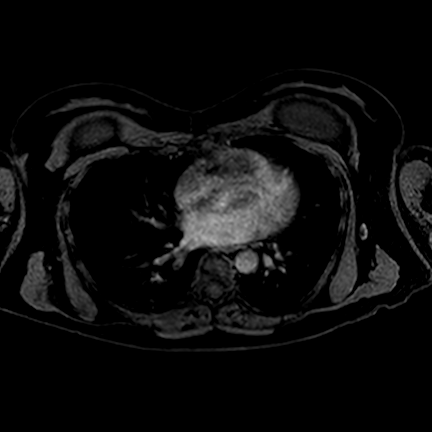

[Series 704: DIXON · axial · 4.0mm · 0.86mm/px · z∈[-47,+197]mm · 4 of 123 slices shown (3 of 5)]
[im 1/123]
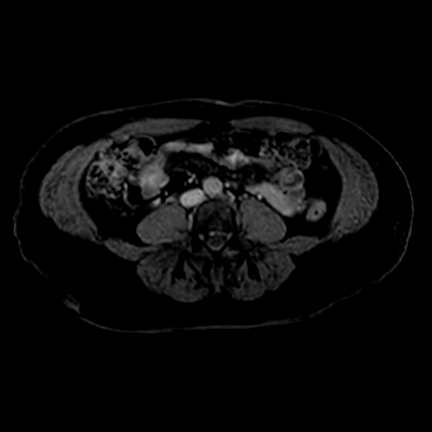
[im 41/123]
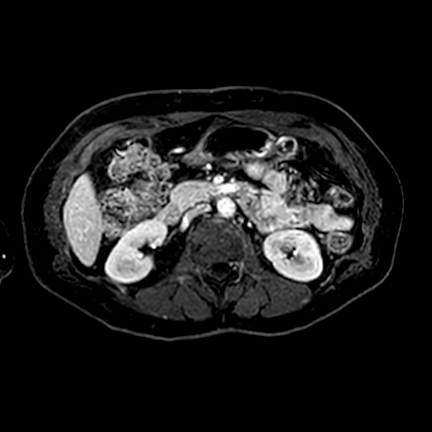
[im 82/123]
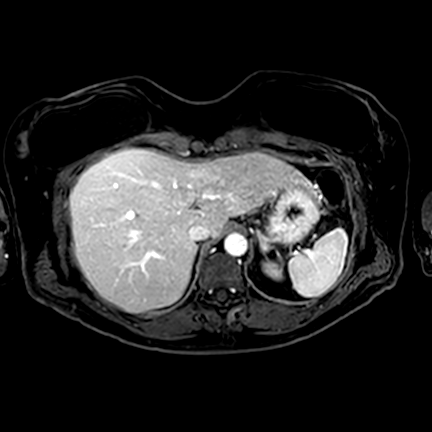
[im 123/123]
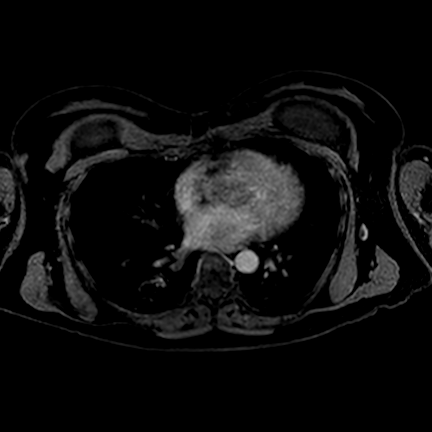

[Series 705: DIXON · axial · 4.0mm · 0.86mm/px · z∈[-47,+197]mm · 4 of 123 slices shown (4 of 5)]
[im 1/123]
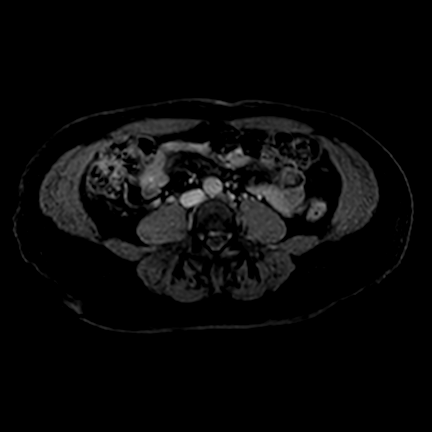
[im 41/123]
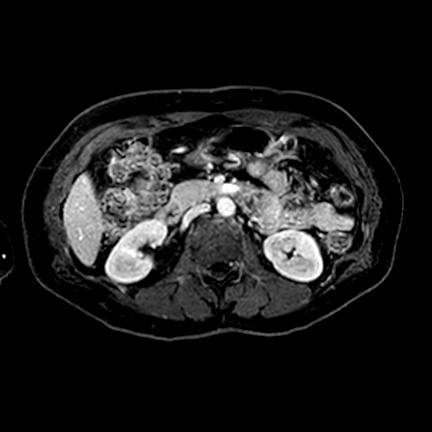
[im 82/123]
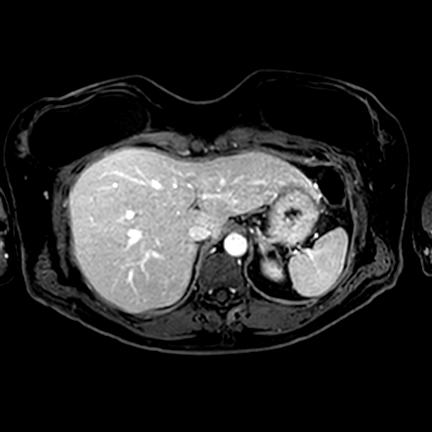
[im 123/123]
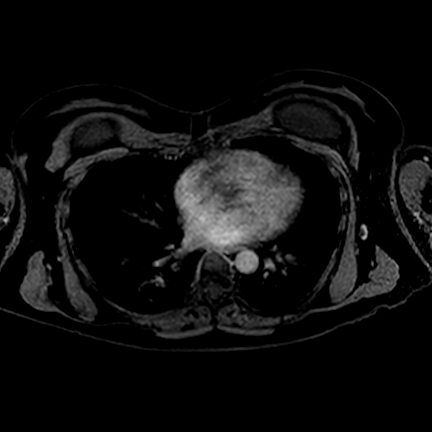

[Series 706: DIXON · axial · 4.0mm · 0.86mm/px · z∈[-47,+197]mm · 4 of 123 slices shown (5 of 5)]
[im 1/123]
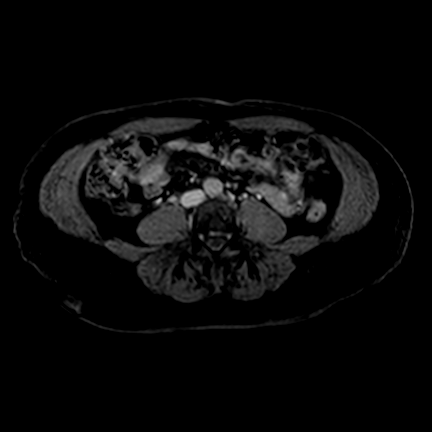
[im 41/123]
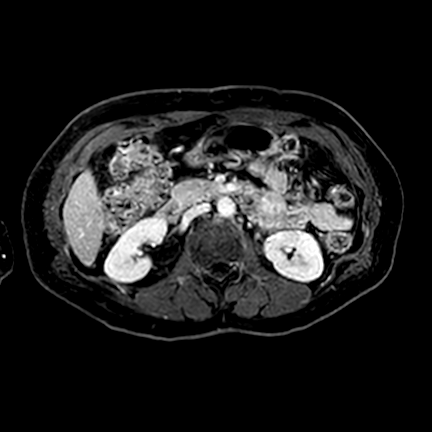
[im 82/123]
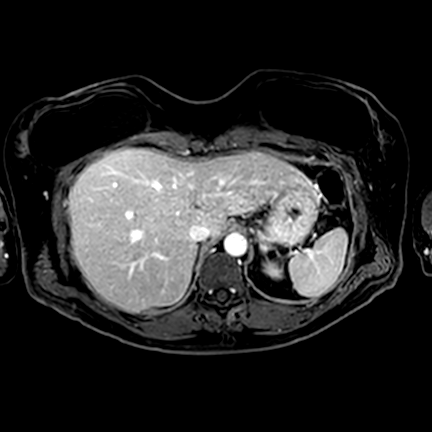
[im 123/123]
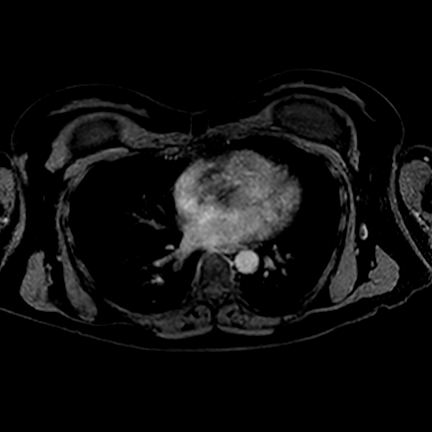

[Series 707: DIXON post-contrast · axial · 4.0mm · 0.86mm/px · z∈[-47,+33]mm · 2 of 123 slices shown]
[im 1/123]
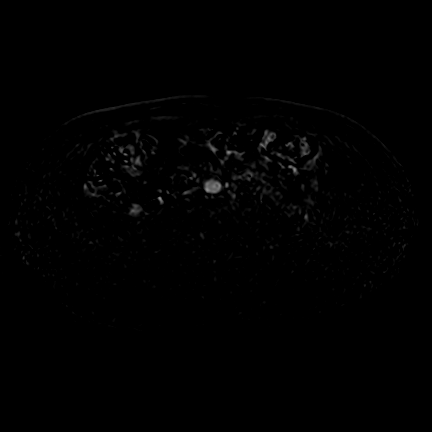
[im 41/123]
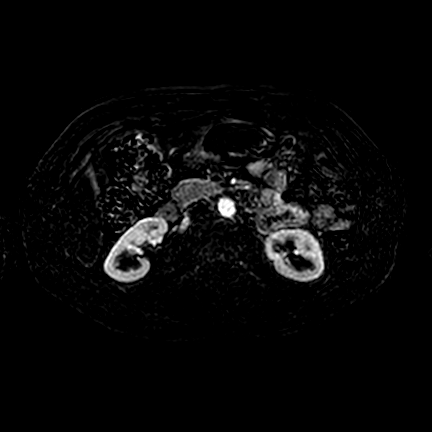

[29 of 48 positions shown; findings below may reference images not displayed]

FINDINGS: The subcapsular lesion described previously as segment 8 and the 
inferior right lobe lesion in segment 6 are again seen and unchanged compared to 
the recent outside MRI. These both show delayed enhancement with filling in on 
delayed images consistent with hemangiomas. No suspicious abnormality seen the 
liver. No new abnormality seen. The gallbladder is unremarkable in appearance. 
The biliary tree is within normal limits. 
The pancreas is unremarkable in appearance without mass or cystic change. 
Pancreatic duct is within normal limits. 
The spleen and adrenal glands are normal in appearance. 
The kidneys are unremarkable in appearance. 
No adenopathy or free fluid. Bilateral saline breast implants are partially 
visualized.
IMPRESSION: 2 hemangiomas in the liver corresponding to the abnormality described on the 
outside MRI. No suspicious abnormality seen.

## 2024-02-17 ENCOUNTER — Telehealth (HOSPITAL_BASED_OUTPATIENT_CLINIC_OR_DEPARTMENT_OTHER): Payer: Self-pay | Admitting: NURSE PRACTITIONER, FAMILY

## 2024-02-17 DIAGNOSIS — Z1231 Encounter for screening mammogram for malignant neoplasm of breast: Secondary | ICD-10-CM

## 2024-02-17 NOTE — Telephone Encounter (Signed)
 Whitney Giles has an upcoming annual in December and she would like to have her screening mammogram done the same day. Could you put an order in the system? Patient is having no issues with her breasts. Please advise. Thanks!

## 2024-02-17 NOTE — Telephone Encounter (Signed)
Order entered for screening mammogram.    Tillie Fantasia, APRN,FNP-BC

## 2024-02-17 NOTE — Telephone Encounter (Signed)
Patient notified of mammogram order entered.  Isaiha Asare, MA

## 2024-02-17 NOTE — Telephone Encounter (Signed)
 Spoke with patient she states she would like to have her mammogram the same day as her annual exam on 05/09/2024. She states no breast lumps or tenderness. Can we put in an order for her?  Katheryn Gal, MA

## 2024-05-05 ENCOUNTER — Encounter (HOSPITAL_BASED_OUTPATIENT_CLINIC_OR_DEPARTMENT_OTHER): Payer: Self-pay | Admitting: NURSE PRACTITIONER, FAMILY

## 2024-05-05 ENCOUNTER — Ambulatory Visit (HOSPITAL_BASED_OUTPATIENT_CLINIC_OR_DEPARTMENT_OTHER): Admitting: OBSTETRICS/GYNECOLOGY

## 2024-05-08 ENCOUNTER — Other Ambulatory Visit: Payer: Self-pay

## 2024-05-09 ENCOUNTER — Ambulatory Visit
Admission: RE | Admit: 2024-05-09 | Discharge: 2024-05-09 | Disposition: A | Discharge status: Home / Self Care | Payer: Self-pay | Source: Ambulatory Visit | Attending: NURSE PRACTITIONER, FAMILY | Admitting: NURSE PRACTITIONER, FAMILY

## 2024-05-09 ENCOUNTER — Encounter (HOSPITAL_BASED_OUTPATIENT_CLINIC_OR_DEPARTMENT_OTHER): Payer: Self-pay | Admitting: NURSE PRACTITIONER, FAMILY

## 2024-05-09 ENCOUNTER — Ambulatory Visit (HOSPITAL_BASED_OUTPATIENT_CLINIC_OR_DEPARTMENT_OTHER): Admitting: NURSE PRACTITIONER, FAMILY

## 2024-05-09 ENCOUNTER — Other Ambulatory Visit (HOSPITAL_BASED_OUTPATIENT_CLINIC_OR_DEPARTMENT_OTHER)

## 2024-05-09 ENCOUNTER — Telehealth (HOSPITAL_BASED_OUTPATIENT_CLINIC_OR_DEPARTMENT_OTHER): Payer: Self-pay | Admitting: OBSTETRICS/GYNECOLOGY

## 2024-05-09 ENCOUNTER — Ambulatory Visit (HOSPITAL_BASED_OUTPATIENT_CLINIC_OR_DEPARTMENT_OTHER): Payer: Self-pay | Admitting: NURSE PRACTITIONER, FAMILY

## 2024-05-09 VITALS — BP 108/64 | Ht 65.5 in | Wt 156.0 lb

## 2024-05-09 DIAGNOSIS — Z1231 Encounter for screening mammogram for malignant neoplasm of breast: Secondary | ICD-10-CM | POA: Insufficient documentation

## 2024-05-09 DIAGNOSIS — Z124 Encounter for screening for malignant neoplasm of cervix: Secondary | ICD-10-CM

## 2024-05-09 DIAGNOSIS — N951 Menopausal and female climacteric states: Secondary | ICD-10-CM

## 2024-05-09 DIAGNOSIS — Z1212 Encounter for screening for malignant neoplasm of rectum: Secondary | ICD-10-CM

## 2024-05-09 DIAGNOSIS — R92342 Mammographic extreme density, left breast: Secondary | ICD-10-CM

## 2024-05-09 DIAGNOSIS — Z01419 Encounter for gynecological examination (general) (routine) without abnormal findings: Secondary | ICD-10-CM

## 2024-05-09 DIAGNOSIS — R92341 Mammographic extreme density, right breast: Secondary | ICD-10-CM

## 2024-05-09 LAB — POCT FECAL IMMUNOCHEMICAL TEST (FIT): FIT FECAL TEST: NEGATIVE

## 2024-05-09 NOTE — Nursing Note (Signed)
 05/09/24 0845   Depression Screen   Little interest or pleasure in doing things. 0   Feeling down, depressed, or hopeless 0   PHQ 2 Total 0

## 2024-05-09 NOTE — Nursing Note (Signed)
 05/09/24 0800   Fecal Immunochemical Test (FIT)   Fit Result Negative   Lot # [id number]   Expiration Date 11/16/26   Internal control valid Yes   Initials DSA

## 2024-05-09 NOTE — Progress Notes (Signed)
 [facility]  OB/GYN, MEDICAL OFFICE BUILDING B  [address]  [place] [address]  [phone]         ANNUAL VISIT    PATIENT: Whitney Giles  CHART NUMBER: [id number]  DATE OF SERVICE: 05/09/2024    Chief Complaint   Patient presents with    Annual Exam     BCM: Post  Bone Density: 06/2023- [place]  Colonoscopy: 2021  Last Mammo: 05/09/2024  Last Pap: 05/05/2023  Hx abn pap: n/a    No problems     HPI: Whitney Giles is a 53 y.o. year old who presents to the office today for her annual exam. No LMP recorded (lmp unknown). Patient is postmenopausal. She has a family history of breast cancer-mother (75 years). She denies hot flashes. She has intermittent brain fog. She denies discharge, urinary or bowel problems. She has been doing bio identical hormones. She lives part of the year in [place], [place] .     PAST MEDICAL HISTORY     Past Medical History:   Diagnosis Date    Depression     Disorder of thyroid     HTN (hypertension)      PAST SURGICAL HISTORY     Past Surgical History:   Procedure Laterality Date    FACIAL COSMETIC SURGERY      HX BREAST AUGMENTATION      HX CESAREAN SECTION      x2     OB HISTORY     OB History   Gravida Para Term Preterm AB Living   2 2 2   2    SAB IAB Ectopic Multiple Live Births       2      # Outcome Date GA Lbr Len/2nd Weight Sex Type Anes PTL Lv   2 Term 68    M CS-Unspec   LIV   1 Term 40    M CS-Unspec   LIV     SOCIAL HISTORY     Social History     Socioeconomic History    Marital status: Married     Spouse name: Engineer, Drilling    Number of children: 2    Years of education: 12    Highest education level: High school graduate   Occupational History    Occupation: home maker   Tobacco Use    Smoking status: Never    Smokeless tobacco: Never   Vaping Use    Vaping status: Never Used   Substance and Sexual Activity    Alcohol use: Yes     Comment: social    Drug use: Not Currently     Types: Marijuana     Comment: gummie    Sexual  activity: Yes     Partners: Male     Birth control/protection: Post-menopausal     FAMILY HISTORY     Family History   Problem Relation Age of Onset    No Known Problems Paternal Grandfather     Brain cancer Paternal Grandmother     Bipolar Disorder Father     Breast Cancer Mother     Drug Abuse Brother     No Known Problems Son     Suicide Half-Brother     Cervical Cancer Neg Hx     Colon Cancer Neg Hx     Liver Cancer Neg Hx     Lung Cancer Neg Hx     Lymphoma Neg Hx     Melanoma  Neg Hx     Ovarian Cancer Neg Hx     Pancreatic Cancer Neg Hx     Thyroid Cancer Neg Hx     Uterine Cancer Neg Hx     Cancer Neg Hx     Prostate Cancer Neg Hx     Leukemia Neg Hx      CURRENT MEDICATIONS      Current Outpatient Medications   Medication Sig    estriol 0.05 % compounded vaginal cream Insert into the vagina Estradiol /progesterone cream    levothyroxine (SYNTHROID) 75 mcg Oral Tablet  (Patient not taking: Reported on 05/09/2024)    lisinopriL-hydrochlorothiazide (ZESTORETIC) 10-12.5 mg Oral Tablet Take 1 Tablet by mouth Every night    metoprolol  succinate (TOPROL -XL) 25 mg Oral Tablet Sustained Release 24 hr Take 1 Tablet (25 mg total) by mouth Once a day for 90 days Indications: myocardial reinfarction prevention (Patient not taking: Reported on 05/09/2024)    NP THYROID 60 mg Oral Tablet Take 1 Tablet (60 mg total) by mouth Daily    progesterone micronized (PROMETRIUM) 100 mg Oral Capsule Take 2 Capsules (200 mg total) by mouth Every night (Patient not taking: Reported on 05/09/2024)    venlafaxine  (EFFEXOR  XR) 150 mg Oral Capsule, Sust. Release 24 hr Take 1 Capsule (150 mg total) by mouth Once a day         ALLERGIES   Patient has no known allergies.       REVIEW OF SYSTEMS    Review of Systems   Constitutional:  Negative for activity change, appetite change, fatigue and unexpected weight change.   HENT:  Negative for congestion, sinus pressure, sneezing and trouble swallowing.    Eyes:  Negative for discharge, itching and  visual disturbance.   Respiratory:  Negative for cough, shortness of breath and wheezing.    Cardiovascular:  Negative for chest pain, palpitations and leg swelling.   Gastrointestinal:  Negative for abdominal distention, abdominal pain, constipation and nausea.   Endocrine: Negative for cold intolerance, heat intolerance and polydipsia.   Genitourinary:  Negative for difficulty urinating, dyspareunia, frequency, menstrual problem, pelvic pain, vaginal bleeding, vaginal discharge and vaginal pain.   Musculoskeletal:  Negative for arthralgias.   Skin:  Negative for color change and rash.   Allergic/Immunologic: Negative for environmental allergies, food allergies and immunocompromised state.   Neurological:  Negative for dizziness, light-headedness, numbness and headaches.   Hematological:  Negative for adenopathy. Does not bruise/bleed easily.   Psychiatric/Behavioral:  Negative for behavioral problems, confusion and sleep disturbance. The patient is not nervous/anxious.           PHYSICAL EXAMINATION     Vitals:    05/09/24 0816   BP: 108/64   Weight: 70.8 kg (156 lb)   Height: 1.664 m (5' 5.5)   BMI: 25.56         Physical Exam  Constitutional:       Appearance: Normal appearance. She is normal weight.   Genitourinary:      Vulva, bladder, rectum and urethral meatus normal.      No lesions in the vagina.      Right Labia: No rash, tenderness or lesions.     Left Labia: No tenderness, lesions or rash.     No vaginal discharge, erythema, tenderness, bleeding or ulceration.      No vaginal prolapse present.     Mild vaginal atrophy present.       Right Adnexa: not tender, not full and no mass  present.     Left Adnexa: not tender, not full and no mass present.     Cervix is not absent.      No cervical discharge, friability, lesion or polyp.      Uterus is midaxial.      Uterus is not absent.     Bladder is not tender.       Pelvic exam was performed with patient in the lithotomy position.   Breasts:     Breasts are  soft.     Right: Breast implant present. No swelling, bleeding, inverted nipple, mass, nipple discharge, skin change or tenderness.      Left: Breast implant present. No swelling, bleeding, inverted nipple, mass, nipple discharge, skin change or tenderness.   HENT:      Head: Normocephalic.   Eyes:      Pupils: Pupils are equal, round, and reactive to light.   Neck:      Thyroid: No thyroid mass, thyromegaly or thyroid tenderness.   Cardiovascular:      Rate and Rhythm: Normal rate and regular rhythm.      Heart sounds: Normal heart sounds, S1 normal and S2 normal. No murmur heard.  Pulmonary:      Effort: Pulmonary effort is normal.      Breath sounds: Normal breath sounds.   Abdominal:      General: Abdomen is flat. Bowel sounds are normal.      Palpations: Abdomen is soft.      Tenderness: There is no abdominal tenderness.   Musculoskeletal:         General: Normal range of motion.      Cervical back: Normal range of motion.   Lymphadenopathy:      Upper Body:      Right upper body: No supraclavicular or axillary adenopathy.      Left upper body: No supraclavicular or axillary adenopathy.   Neurological:      General: No focal deficit present.      Mental Status: She is alert and oriented to person, place, and time.   Skin:     General: Skin is warm and dry.   Psychiatric:         Attention and Perception: Attention normal.         Mood and Affect: Mood normal. Mood is not anxious or depressed.         Speech: Speech normal.         Behavior: Behavior normal.         Thought Content: Thought content normal.         Cognition and Memory: Cognition and memory normal.         Judgment: Judgment normal.   Vitals and nursing note reviewed.        DEPRESSION SCREENING   Nursing Notes:   Valere Mar, [place]  05/09/24 0845  Signed     05/09/24 0845   Depression Screen   Little interest or pleasure in doing things. 0   Feeling down, depressed, or hopeless 0   PHQ 2 Total 0         Valere Mar, [place]  05/09/24 0932   Signed     05/09/24 0800   Fecal Immunochemical Test (FIT)   Fit Result Negative   Lot # [id number]   Expiration Date 11/16/26   Internal control valid Yes   Initials DSA       ASSESSMENT/PLAN       ICD-10-CM  1. Encounter for well woman exam with routine gynecological exam  Z01.419       2. Menopausal state  N95.1       3. Screening for cervical cancer  Z12.4 CYTOPATHOLOGY, GYN +/- HIGH RISK HPV      4. Breast cancer screening by mammogram  Z12.31 MAMMO BILATERAL SCREENING-ADDL VIEWS/BREAST US  AS REQ BY RAD      5. Screening for rectal cancer  Z12.12 POCT FECAL IMMUNOCHEMICAL TEST (FIT)        EDUCATION   Monthly self breast examinations    FOLLOW-UP   Return in about 1 year (around 05/09/2025), or if symptoms worsen or fail to improve, for Larkin Morelos FNP-BC, Annual physical.         Loa GORMAN Gearing, APRN, CNP

## 2024-05-10 ENCOUNTER — Ambulatory Visit (HOSPITAL_BASED_OUTPATIENT_CLINIC_OR_DEPARTMENT_OTHER): Payer: Self-pay | Admitting: OBSTETRICS/GYNECOLOGY

## 2024-05-16 LAB — CYTOPATHOLOGY, GYN +/- HIGH RISK HPV

## 2024-05-17 ENCOUNTER — Ambulatory Visit (HOSPITAL_BASED_OUTPATIENT_CLINIC_OR_DEPARTMENT_OTHER): Payer: Self-pay | Admitting: NURSE PRACTITIONER, FAMILY

## 2024-05-17 NOTE — Result Encounter Note (Signed)
 Your pap is negative and normal. Follow up as scheduled.    Crystalee Ventress FNP-BC

## 2024-05-18 LAB — HUMAN PAPILLOMA VIRUS (HPV) BY PCR WITH HIGH RISK GENOTYPING (THINPREP)
HPV OTHER: NEGATIVE
HPV16 PCR: NEGATIVE
HPV18 PCR: NEGATIVE

## 2025-05-10 ENCOUNTER — Other Ambulatory Visit (HOSPITAL_BASED_OUTPATIENT_CLINIC_OR_DEPARTMENT_OTHER): Payer: Self-pay | Admitting: NURSE PRACTITIONER, FAMILY

## 2025-05-10 ENCOUNTER — Ambulatory Visit
